# Patient Record
Sex: Female | Born: 1990 | Race: Black or African American | Hispanic: No | Marital: Single
Health system: Southern US, Community
[De-identification: ages and names within clinical notes are randomized; demographics above are authoritative.]

---

## 2017-11-20 DIAGNOSIS — H60332 Swimmer's ear, left ear: Secondary | ICD-10-CM | POA: Diagnosis not present

## 2017-11-20 DIAGNOSIS — H6123 Impacted cerumen, bilateral: Secondary | ICD-10-CM | POA: Diagnosis not present

## 2018-05-31 DIAGNOSIS — B001 Herpesviral vesicular dermatitis: Secondary | ICD-10-CM | POA: Diagnosis not present

## 2018-05-31 DIAGNOSIS — J4520 Mild intermittent asthma, uncomplicated: Secondary | ICD-10-CM | POA: Diagnosis not present

## 2018-05-31 DIAGNOSIS — Z6828 Body mass index (BMI) 28.0-28.9, adult: Secondary | ICD-10-CM | POA: Diagnosis not present

## 2018-07-05 DIAGNOSIS — J101 Influenza due to other identified influenza virus with other respiratory manifestations: Secondary | ICD-10-CM | POA: Diagnosis not present

## 2018-07-05 DIAGNOSIS — R6889 Other general symptoms and signs: Secondary | ICD-10-CM | POA: Diagnosis not present

## 2018-07-05 DIAGNOSIS — Z20828 Contact with and (suspected) exposure to other viral communicable diseases: Secondary | ICD-10-CM | POA: Diagnosis not present

## 2018-09-20 DIAGNOSIS — R300 Dysuria: Secondary | ICD-10-CM | POA: Diagnosis not present

## 2018-09-20 DIAGNOSIS — Z6828 Body mass index (BMI) 28.0-28.9, adult: Secondary | ICD-10-CM | POA: Diagnosis not present

## 2018-09-20 DIAGNOSIS — Z113 Encounter for screening for infections with a predominantly sexual mode of transmission: Secondary | ICD-10-CM | POA: Diagnosis not present

## 2019-04-06 DIAGNOSIS — U071 COVID-19: Secondary | ICD-10-CM | POA: Diagnosis not present

## 2019-04-19 ENCOUNTER — Emergency Department (HOSPITAL_COMMUNITY): Payer: BC Managed Care – PPO

## 2019-04-19 ENCOUNTER — Other Ambulatory Visit: Payer: Self-pay

## 2019-04-19 ENCOUNTER — Inpatient Hospital Stay (HOSPITAL_COMMUNITY)
Admission: EM | Admit: 2019-04-19 | Discharge: 2019-04-21 | DRG: 176 | Disposition: A | Discharge status: Home / Self Care | Payer: BC Managed Care – PPO | Attending: Internal Medicine | Admitting: Internal Medicine

## 2019-04-19 ENCOUNTER — Encounter (HOSPITAL_COMMUNITY): Payer: Self-pay

## 2019-04-19 DIAGNOSIS — N920 Excessive and frequent menstruation with regular cycle: Secondary | ICD-10-CM | POA: Diagnosis present

## 2019-04-19 DIAGNOSIS — Z832 Family history of diseases of the blood and blood-forming organs and certain disorders involving the immune mechanism: Secondary | ICD-10-CM | POA: Diagnosis not present

## 2019-04-19 DIAGNOSIS — R0602 Shortness of breath: Secondary | ICD-10-CM | POA: Diagnosis not present

## 2019-04-19 DIAGNOSIS — R079 Chest pain, unspecified: Secondary | ICD-10-CM | POA: Diagnosis not present

## 2019-04-19 DIAGNOSIS — I4510 Unspecified right bundle-branch block: Secondary | ICD-10-CM | POA: Diagnosis not present

## 2019-04-19 DIAGNOSIS — U071 COVID-19: Secondary | ICD-10-CM | POA: Diagnosis present

## 2019-04-19 DIAGNOSIS — I2699 Other pulmonary embolism without acute cor pulmonale: Principal | ICD-10-CM | POA: Diagnosis present

## 2019-04-19 DIAGNOSIS — Z8619 Personal history of other infectious and parasitic diseases: Secondary | ICD-10-CM | POA: Diagnosis not present

## 2019-04-19 DIAGNOSIS — Z7901 Long term (current) use of anticoagulants: Secondary | ICD-10-CM | POA: Diagnosis not present

## 2019-04-19 DIAGNOSIS — Z91018 Allergy to other foods: Secondary | ICD-10-CM | POA: Diagnosis not present

## 2019-04-19 DIAGNOSIS — I5189 Other ill-defined heart diseases: Secondary | ICD-10-CM | POA: Diagnosis not present

## 2019-04-19 DIAGNOSIS — Z8249 Family history of ischemic heart disease and other diseases of the circulatory system: Secondary | ICD-10-CM | POA: Diagnosis not present

## 2019-04-19 LAB — BASIC METABOLIC PANEL
Anion gap: 10 (ref 5–15)
BUN: 9 mg/dL (ref 6–20)
CO2: 23 mmol/L (ref 22–32)
Calcium: 9.2 mg/dL (ref 8.9–10.3)
Chloride: 105 mmol/L (ref 98–111)
Creatinine, Ser: 0.71 mg/dL (ref 0.44–1.00)
GFR calc Af Amer: >60 mL/min (ref >60)
GFR calc non Af Amer: >60 mL/min (ref >60)
Glucose, Bld: 84 mg/dL (ref 70–99)
Potassium: 3.4 mmol/L — ABNORMAL LOW (ref 3.5–5.1)
Sodium: 138 mmol/L (ref 135–145)

## 2019-04-19 LAB — CBC
HCT: 37.4 % (ref 36.0–46.0)
Hemoglobin: 12.5 g/dL (ref 12.0–15.0)
MCH: 29.3 pg (ref 26.0–34.0)
MCHC: 33.4 g/dL (ref 30.0–36.0)
MCV: 87.6 fL (ref 80.0–100.0)
Platelets: 231 10*3/uL (ref 150–400)
RBC: 4.27 MIL/uL (ref 3.87–5.11)
RDW: 13.1 % (ref 11.5–15.5)
WBC: 7.5 10*3/uL (ref 4.0–10.5)
nRBC: 0 % (ref 0.0–0.2)

## 2019-04-19 LAB — I-STAT BETA HCG BLOOD, ED (MC, WL, AP ONLY): I-stat hCG, quantitative: <5 m[IU]/mL (ref <5)

## 2019-04-19 LAB — TROPONIN I (HIGH SENSITIVITY)
Troponin I (High Sensitivity): <2 ng/L (ref <18)
Troponin I (High Sensitivity): <2 ng/L (ref <18)

## 2019-04-19 MED ORDER — SODIUM CHLORIDE 0.9% FLUSH: 3.0000 mL | Freq: Once | INTRAVENOUS | Status: DC

## 2019-04-19 NOTE — ED Triage Notes (Signed)
Pt tested positive for COVID on 11/7 and states she has been having worsening SOB and chest pain, worse at night. Also reports a knot in the center of her chest. Pt a.o, resp e.u

## 2019-04-20 ENCOUNTER — Encounter (HOSPITAL_COMMUNITY): Payer: Self-pay | Admitting: Internal Medicine

## 2019-04-20 ENCOUNTER — Emergency Department (HOSPITAL_COMMUNITY): Payer: BC Managed Care – PPO

## 2019-04-20 DIAGNOSIS — Z8249 Family history of ischemic heart disease and other diseases of the circulatory system: Secondary | ICD-10-CM

## 2019-04-20 DIAGNOSIS — I5189 Other ill-defined heart diseases: Secondary | ICD-10-CM

## 2019-04-20 DIAGNOSIS — Z832 Family history of diseases of the blood and blood-forming organs and certain disorders involving the immune mechanism: Secondary | ICD-10-CM | POA: Diagnosis not present

## 2019-04-20 DIAGNOSIS — Z8619 Personal history of other infectious and parasitic diseases: Secondary | ICD-10-CM | POA: Diagnosis not present

## 2019-04-20 DIAGNOSIS — I4510 Unspecified right bundle-branch block: Secondary | ICD-10-CM | POA: Diagnosis not present

## 2019-04-20 DIAGNOSIS — Z7901 Long term (current) use of anticoagulants: Secondary | ICD-10-CM | POA: Diagnosis not present

## 2019-04-20 DIAGNOSIS — N920 Excessive and frequent menstruation with regular cycle: Secondary | ICD-10-CM | POA: Diagnosis present

## 2019-04-20 DIAGNOSIS — U071 COVID-19: Secondary | ICD-10-CM | POA: Diagnosis not present

## 2019-04-20 DIAGNOSIS — I2699 Other pulmonary embolism without acute cor pulmonale: Secondary | ICD-10-CM | POA: Diagnosis present

## 2019-04-20 LAB — TROPONIN I (HIGH SENSITIVITY): Troponin I (High Sensitivity): <2 ng/L (ref <18)

## 2019-04-20 LAB — SARS CORONAVIRUS 2 (TAT 6-24 HRS): SARS Coronavirus 2: POSITIVE — AB

## 2019-04-20 LAB — D-DIMER, QUANTITATIVE: D-Dimer, Quant: 2.9 ug/mL-FEU — ABNORMAL HIGH (ref 0.00–0.50)

## 2019-04-20 LAB — HEPARIN LEVEL (UNFRACTIONATED): Heparin Unfractionated: 0.89 IU/mL — ABNORMAL HIGH (ref 0.30–0.70)

## 2019-04-20 LAB — APTT: aPTT: 30 seconds (ref 24–36)

## 2019-04-20 LAB — PROTIME-INR
INR: 1.3 — ABNORMAL HIGH (ref 0.8–1.2)
Prothrombin Time: 15.7 seconds — ABNORMAL HIGH (ref 11.4–15.2)

## 2019-04-20 MED ORDER — HEPARIN (PORCINE) 25000 UT/250ML-% IV SOLN
1000.0000 [IU]/h | INTRAVENOUS | Status: DC
  Administered 2019-04-20: 1250 [IU]/h via INTRAVENOUS
  Filled 2019-04-20 (×2): qty 250

## 2019-04-20 MED ORDER — HEPARIN BOLUS VIA INFUSION
4500.0000 [IU] | Freq: Once | INTRAVENOUS | Status: AC
  Administered 2019-04-20: 4500 [IU] via INTRAVENOUS
  Filled 2019-04-20: qty 4500

## 2019-04-20 MED ORDER — ONDANSETRON HCL 4 MG/2ML IJ SOLN
4.0000 mg | Freq: Four times a day (QID) | INTRAMUSCULAR | Status: DC | PRN
  Administered 2019-04-20: 4 mg via INTRAVENOUS
  Filled 2019-04-20: qty 2

## 2019-04-20 MED ORDER — IBUPROFEN 400 MG PO TABS
400.0000 mg | ORAL_TABLET | Freq: Once | ORAL | Status: AC
  Administered 2019-04-20: 400 mg via ORAL
  Filled 2019-04-20: qty 1

## 2019-04-20 MED ORDER — ACETAMINOPHEN 650 MG RE SUPP: 650.0000 mg | Freq: Four times a day (QID) | RECTAL | Status: DC | PRN

## 2019-04-20 MED ORDER — ACETAMINOPHEN 325 MG PO TABS
650.0000 mg | ORAL_TABLET | Freq: Four times a day (QID) | ORAL | Status: DC | PRN
  Administered 2019-04-20 – 2019-04-21 (×2): 650 mg via ORAL
  Filled 2019-04-20 (×2): qty 2

## 2019-04-20 MED ORDER — ONDANSETRON HCL 4 MG PO TABS: 4.0000 mg | ORAL_TABLET | Freq: Four times a day (QID) | ORAL | Status: DC | PRN

## 2019-04-20 MED ORDER — IOHEXOL 350 MG/ML SOLN
100.0000 mL | Freq: Once | INTRAVENOUS | Status: AC | PRN
  Administered 2019-04-20: 100 mL via INTRAVENOUS

## 2019-04-20 NOTE — ED Provider Notes (Signed)
[facility] EMERGENCY DEPARTMENT Provider Note   CSN: [id number] Arrival date & time: 04/19/19  1956     History   Chief Complaint Chief Complaint  Patient presents with  . Chest Pain  . Shortness of Breath    HPI Chelsea [name] is a 28 y.o. female.     The history is provided by the patient.  Chest Pain Pain location:  L chest Pain quality: sharp   Pain radiates to:  Does not radiate Pain severity:  Moderate Timing:  Intermittent Progression:  Unchanged Chronicity:  New Relieved by:  Nothing Exacerbated by: "worse at night" Associated symptoms: cough, dizziness and shortness of breath   Associated symptoms: no fever, no lower extremity edema and no vomiting   Shortness of Breath Associated symptoms: chest pain and cough   Associated symptoms: no fever and no vomiting    Patient presents with chest pain. She reports that the past 2 nights she has had chest pain.  She reports short of breath.  No fevers or vomiting.  She does report cough.  No dyspnea on exertion.  No pleuritic pain She is not on oral contraceptives.  She is a non-smoker.. She denies any known chronic medical conditions. No hemoptysis Patient reports she was diagnosed with COVID-19 on November 7 of this month.  She reports she had cough and myalgias at that time which prompted testing The chest pain/shortness of breath are new for patient She also reports intermittent dizziness  PMH-none Soc hx nonsmoker OB History   No obstetric history on file.      Home Medications    Prior to Admission medications   Not on File    Family History No family history on file.  Social History Social History   Tobacco Use  . Smoking status: Not on file  Substance Use Topics  . Alcohol use: Not on file  . Drug use: Not on file     Allergies   Patient has no allergy information on record.   Review of Systems Review of Systems  Constitutional: Negative for fever.   Respiratory: Positive for cough and shortness of breath.   Cardiovascular: Positive for chest pain. Negative for leg swelling.  Gastrointestinal: Negative for vomiting.  Neurological: Positive for dizziness.  All other systems reviewed and are negative.    Physical Exam Updated Vital Signs BP (!) 100/55   Pulse 76   Temp 98.7 F (37.1 C) (Oral)   Resp 16   Ht 1.626 m (5\' 4" )   Wt 74.8 kg   LMP 03/19/2019   SpO2 96%   BMI 28.32 kg/m   Physical Exam CONSTITUTIONAL: Well developed/well nourished HEAD: Normocephalic/atraumatic EYES: EOMI/PERRL ENMT: Mucous membranes moist NECK: supple no meningeal signs SPINE/BACK:entire spine nontender CV: S1/S2 noted, no murmurs/rubs/gallops noted LUNGS: Lungs are clear to auscultation bilaterally, no apparent distress ABDOMEN: soft, nontender, no rebound or guarding, bowel sounds noted throughout abdomen GU:no cva tenderness NEURO: Pt is awake/alert/appropriate, moves all extremitiesx4.  No facial droop.   EXTREMITIES: pulses normal/equal, full ROM, no lower extremity return SKIN: warm, color normal PSYCH: no abnormalities of mood noted, alert and oriented to situation   ED Treatments / Results  Labs (all labs ordered are listed, but only abnormal results are displayed) Labs Reviewed  BASIC METABOLIC PANEL - Abnormal; Notable for the following components:      Result Value   Potassium 3.4 (*)    All other components within normal limits  D-DIMER, QUANTITATIVE (NOT AT  [facility]) - Abnormal; Notable for the following components:   D-Dimer, Quant 2.90 (*)    All other components within normal limits  CBC  I-STAT BETA HCG BLOOD, ED ([facility], [facility], [facility] ONLY)  TROPONIN I (HIGH SENSITIVITY)  TROPONIN I (HIGH SENSITIVITY)  TROPONIN I (HIGH SENSITIVITY)    EKG EKG Interpretation  Date/Time:  Wednesday April 20 2019 05:24:18 EST Ventricular Rate:  81 PR Interval:    QRS Duration: 115 QT Interval:  375 QTC Calculation: 436 R Axis:   89  Text Interpretation: Sinus rhythm Incomplete right bundle branch block Low voltage, precordial leads No previous ECGs available Confirmed by Zadie Rhine ([id number]) on 04/20/2019 5:43:39 AM   Radiology Dg Chest Portable 1 View  Result Date: 04/19/2019 CLINICAL DATA:  28 year old female tested positive for COVID-19 10 days ago with increasing shortness of breath and chest pain. EXAM: PORTABLE CHEST 1 VIEW COMPARISON:  None. FINDINGS: Portable AP semi upright view at 2139 hours. Somewhat large lung volumes. Mediastinal contours are within normal limits for portable technique. Visualized tracheal air column is within normal limits. Allowing for portable technique the lungs are clear. No pneumothorax or pleural effusion. No osseous abnormality identified. IMPRESSION: Possible pulmonary hyperinflation but no acute cardiopulmonary abnormality. Electronically Signed   By: Odessa Fleming M.D.   On: 04/19/2019 22:15    Procedures Procedures    Medications Ordered in ED Medications  sodium chloride flush (NS) 0.9 % injection 3 mL (has no administration in time range)  ibuprofen (ADVIL) tablet 400 mg (400 mg Oral Given 04/20/19 0524)     Initial Impression / Assessment and Plan / ED Course  I have reviewed the triage vital signs and the nursing notes.  Pertinent labs & imaging results that were available during my care of the patient were reviewed by me and considered in my medical decision making (see chart for details).        5:18 AM Patient presents for evaluation of chest pain.  She was diagnosed with Covid 11 days ago.  Now having chest pain/shortness of breath.  Initial work-up in the ED is unremarkable.  Will obtain D-dimer.  7:12 AM ddimer elevated Signed out to dr Lockie Mola with CT imaging pending   Chelsea [name] was evaluated in Emergency Department on 04/20/2019 for the symptoms described in the history of present illness. She was evaluated in the context of the global COVID-19  pandemic, which necessitated consideration that the patient might be at risk for infection with the SARS-CoV-2 virus that causes COVID-19. Institutional protocols and algorithms that pertain to the evaluation of patients at risk for COVID-19 are in a state of rapid change based on information released by regulatory bodies including the CDC and federal and state organizations. These policies and algorithms were followed during the patient's care in the ED.  Final Clinical Impressions(s) / ED Diagnoses   Final diagnoses:  None    ED Discharge Orders    None       Zadie Rhine, MD 04/20/19 [phone]

## 2019-04-20 NOTE — ED Notes (Signed)
Ordered diet tray 

## 2019-04-20 NOTE — ED Notes (Signed)
Pt is NSR on monitor 

## 2019-04-20 NOTE — ED Notes (Signed)
Lunch ordered 

## 2019-04-20 NOTE — Progress Notes (Signed)
ANTICOAGULATION CONSULT NOTE - Initial Consult  Pharmacy Consult for IV Heparin Indication: pulmonary embolus  Allergies  Allergen Reactions  . Other Itching    Bananas make her mouth itch    Patient Measurements: Height: 5\' 4"  (162.6 cm) Weight: 165 lb (74.8 kg) IBW/kg (Calculated) : 54.7 Heparin Dosing Weight: 70 kg  Vital Signs: BP: 99/87 (11/18 1900) Pulse Rate: 114 (11/18 1900)  Labs: Recent Labs    04/19/19 2048 04/19/19 2226 04/20/19 0041 04/20/19 1215  HGB 12.5  --   --   --   HCT 37.4  --   --   --   PLT 231  --   --   --   APTT  --   --   --  30  LABPROT  --   --   --  15.7*  INR  --   --   --  1.3*  CREATININE 0.71  --   --   --   TROPONINIHS <2 <2 <2  --     Estimated Creatinine Clearance: 102.7 mL/min (by C-G formula based on SCr of 0.71 mg/dL).   Medical History: History reviewed. No pertinent past medical history.   Assessment: 28 year old female who presents to ED with chest pain and recent diagnosis of COVID on November 7th with elevated d-dimer (2.90) and Ct Angio positive for right lower o PE. Patient was not on anticoagulation prior to admission. Patient is on contact - attempted to call into room without answer. Spoke with patient's RN who helped confirm no anticoagulation prior to admission.   CBC is within normal limits.  No bleeding reported.  Renal function is normal.   Goal of Therapy:  Heparin level 0.3-0.7 units/ml Monitor platelets by anticoagulation protocol: Yes   Plan:  - Heparin level is supra-therapeutic at this time  - Will decrease the patients heparin drip to 1100 units/hr  - Will follow heparin levels every 6 hours until therapeutic then daily heparin levels and CBC while on therapy.  - Monitor for s/s of bleeding   Duanne Limerick, PharmD, BCPS Clinical Pharmacist Please refer to [facility] for [place] numbers 04/20/2019,7:51 PM

## 2019-04-20 NOTE — ED Notes (Signed)
Jeneen Rinks, RN witnessed dose change of heparin drip

## 2019-04-20 NOTE — ED Provider Notes (Signed)
Assumed care of patient at 7 AM.  Patient with chest pain, shortness of breath over the last 2 days.  Had a positive Covid test back on November 4 at her college.  No fever.  Lab work overall unremarkable.  Chest x-ray no signs of pneumonia.  Patient on room air.  Radiology called me on the phone to state that patient has pulmonary embolism on the right side of the chest.  Has mild heart strain.  Patient is not tachycardic.  Blood pressure has been stable at 100s over 70s.  Started on heparin IV bolus and infusion for PE.  Will obtain a new coronavirus test.  Will admit for further care.  Hemodynamically stable at this time.  PE possibly as sequelae from Covid however patient sister also has a history of blood clots and PE.  This chart was dictated using voice recognition software.  Despite best efforts to proofread,  errors can occur which can change the documentation meaning.  .Critical Care Performed by: Lennice Sites, DO Authorized by: Lennice Sites, DO   Critical care provider statement:    Critical care time (minutes):  40   Critical care was necessary to treat or prevent imminent or life-threatening deterioration of the following conditions:  Respiratory failure   Critical care was time spent personally by me on the following activities:  Blood draw for specimens, development of treatment plan with patient or surrogate, discussions with primary provider, evaluation of patient's response to treatment, examination of patient, obtaining history from patient or surrogate, ordering and performing treatments and interventions, ordering and review of laboratory studies, ordering and review of radiographic studies, pulse oximetry, re-evaluation of patient's condition and review of old charts   I assumed direction of critical care for this patient from another provider in my specialty: no        Lennice Sites, DO 04/20/19 [phone]

## 2019-04-20 NOTE — ED Notes (Signed)
Pt to CT

## 2019-04-20 NOTE — H&P (Signed)
Date: 04/20/2019               Patient Name:  Chelsea [name] MRN: [id number]  DOB: [birth year] Age / Sex: 28 y.o., female   PCP: Patient, No Pcp Per         Medical Service: Internal Medicine Teaching Service         Attending Physician: Dr. Lucious Groves, DO    First Contact: Dr. Court Joy  Pager: [phone]  Second Contact: Dr. Koleen Distance Pager: [phone]       After Hours (After 5p/  First Contact Pager: [phone]  weekends / holidays): Second Contact Pager: [phone]   Chief Complaint: Chest pain   History of Present Illness: Chelsea [name] is a 28 y.o female recently diagnosed with COVID-19 who presented to the emergency department with three days of pleuritic chest pain. History was obtained via the patient and through chart review.  On November 3 the patient and her brother developed "cold" like symptoms including myalgias, arthralgias, cough, fatigue and decreased appetite. She is a Ship broker at [employer] and subsequently went for [place] testing the returned positive on November 7. Her symptoms had slowly been resolving however approximately three days she noticed pleuritic chest pain and shortness of breath. She felt that it was worse during the [name]. She did not notice any exertional component as she has been fairly inactive since being diagnosed with COVID.  She does not use oral contraceptives. She does not smoke or use illicit substances. She is no history of miscarriages or previous blood clots. Her sister did have DVT/PE approximately four years ago and is on chronic Eliquis therapy for this. Prior to her blood clot she was started on oral contraceptives however they were uncertain if this was the provoking factor so she is now on lifelong anticoagulation. She also had a paternal uncle that had blood clots. She does not believe anybody in the family has ever had a miscarriage. Nobody in the family has been diagnosed with antiphospholipid syndrome or lupus.  She does have heavy  menstrual periods. They're fairly regular. She is never had a Pap smear. She is not sure if she is fibroids. She is not noticed any changes in her urine. She has not noticed any lower extremity swelling/edema.  Meds:  Patient is not on any prescription or OTC medications/herbal supplements.   Allergies: Allergies as of 04/19/2019  . (Not on File)   History reviewed. No pertinent past medical history.  Family History  Problem Relation Age of Onset  . Pulmonary embolism Sister   . Clotting disorder Other    Social History: Patient is currently a Ship broker at [employer]. He lives with her brother and her sister. She is a never smoker, occasional drinker, and has never used illicit substances.  Review of Systems: A complete ROS was negative except as per HPI.   Physical Exam: Blood pressure 103/71, pulse 77, temperature 98.7 F (37.1 C), temperature source Oral, resp. rate 18, height 5\' 4"  (1.626 m), weight 74.8 kg, last menstrual period 03/19/2019, SpO2 97 %.  General: Well nourished female in no acute distress HENT: Normocephalic, atraumatic, moist mucus membranes Pulm: Good air movement with no wheezing or crackles  CV: RRR, fixed splitting of S2, no murmurs, no rubs, no JVD Abdomen: Active bowel sounds, soft, non-distended, no tenderness to palpation  Extremities: Pulses palpable in all extremities, no LE edema  Skin: Warm and dry  Neuro: Alert and oriented x 3  EKG: personally reviewed: my  interpretation is sinus rhythm with borderline right axis deviation. Incomplete right bundle branch block. No prior EKG to compare.  CTA Chest  1. Changes consistent with right lower lobe pulmonary emboli as well as evidence of right heart strain 2. 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 CTA of the chest subsequently illustrated findings consistent with right lower lobe pulmonary emboli as well as evidence of right heart strain. She was started on IV heparin and admitted for further evaluation/management.   Submassive Pulmonary Embolism  - Currently hemodynamically stable without tachycardia or increased oxygen demand. However, she does have findings consistent with right heart strain including RBBB, fixed splitting of her S2, and CT findings.  - PESI score 29, indicating; Class I, Very Low Risk: 0-1.6% 30-[name] mortality in this group. - Family history significant for VTE/PE in her sister (may have been provoked by OCP) and maternal uncle.  - Given family history will check PT, aPTT, Factor V, and Antiphospholipid panel (checking prior to starting heparin) - Given heavy menstruations evaluating for uterine fibroids could also be considered as this can cause May-Thurner syndrome and predispose to VTE/PE - COVID has also been reported to cause a prothrombotic states. Therefore, this may be a provoked VTE/PE and she may only need anticoagulation for 3 months. - Will monitor on telemetry overnight and transition to Eliquis in the AM   Diet: Regular  VTE ppx: Full dose heparin  CODE STATUS: Full Code  Dispo: Admit patient to Observation with expected length of stay less than 2 midnights.  SignedLevora Dredge, MD 04/20/2019, 10:44 AM  Pager: [phone]

## 2019-04-20 NOTE — ED Notes (Signed)
Gave patient a bedside toilet patient is resting with call bell in reach

## 2019-04-20 NOTE — ED Notes (Signed)
Admitting at bedside 

## 2019-04-20 NOTE — ED Notes (Signed)
Attempted IV without success.  Placed IV team order.

## 2019-04-20 NOTE — Progress Notes (Signed)
ANTICOAGULATION CONSULT NOTE - Initial Consult  Pharmacy Consult for IV Heparin Indication: pulmonary embolus  Not on File  Patient Measurements: Height: 5\' 4"  (162.6 cm) Weight: 165 lb (74.8 kg) IBW/kg (Calculated) : 54.7 Heparin Dosing Weight: 70 kg  Vital Signs: Temp: 98.7 F (37.1 C) (11/17 2346) Temp Source: Oral (11/17 2346) BP: 103/71 (11/18 0545) Pulse Rate: 77 (11/18 0826)  Labs: Recent Labs    04/19/19 2048 04/19/19 2226 04/20/19 0041  HGB 12.5  --   --   HCT 37.4  --   --   PLT 231  --   --   CREATININE 0.71  --   --   TROPONINIHS <2 <2 <2    Estimated Creatinine Clearance: 102.7 mL/min (by C-G formula based on SCr of 0.71 mg/dL).   Medical History: History reviewed. No pertinent past medical history.   Assessment: 28 year old female who presents to ED with chest pain and recent diagnosis of COVID on November 7th with elevated d-dimer (2.90) and Ct Angio positive for right lower o PE. Patient was not on anticoagulation prior to admission. Patient is on contact - attempted to call into room without answer. Spoke with patient's RN who helped confirm no anticoagulation prior to admission.   CBC is within normal limits.  No bleeding reported.  Renal function is normal.   Goal of Therapy:  Heparin level 0.3-0.7 units/ml Monitor platelets by anticoagulation protocol: Yes   Plan:  Heparin bolus 4500 units IV x1 Heparin drip at 1250 units/hr. Heparin level in 6 hours. Will follow heparin levels every 6 hours until therapeutic then daily heparin levels and CBC while on therapy.    Sloan Leiter, PharmD, BCPS, BCCCP Clinical Pharmacist Please refer to [facility] for [place] numbers 04/20/2019,9:45 AM

## 2019-04-20 NOTE — ED Notes (Signed)
IV team at bedside 

## 2019-04-21 DIAGNOSIS — Z91018 Allergy to other foods: Secondary | ICD-10-CM

## 2019-04-21 DIAGNOSIS — N920 Excessive and frequent menstruation with regular cycle: Secondary | ICD-10-CM

## 2019-04-21 DIAGNOSIS — I2699 Other pulmonary embolism without acute cor pulmonale: Principal | ICD-10-CM

## 2019-04-21 DIAGNOSIS — Z7901 Long term (current) use of anticoagulants: Secondary | ICD-10-CM

## 2019-04-21 DIAGNOSIS — U071 COVID-19: Secondary | ICD-10-CM

## 2019-04-21 LAB — CBC
HCT: 36.3 % (ref 36.0–46.0)
Hemoglobin: 11.9 g/dL — ABNORMAL LOW (ref 12.0–15.0)
MCH: 29.5 pg (ref 26.0–34.0)
MCHC: 32.8 g/dL (ref 30.0–36.0)
MCV: 89.9 fL (ref 80.0–100.0)
Platelets: 217 10*3/uL (ref 150–400)
RBC: 4.04 MIL/uL (ref 3.87–5.11)
RDW: 13.1 % (ref 11.5–15.5)
WBC: 6.9 10*3/uL (ref 4.0–10.5)
nRBC: 0 % (ref 0.0–0.2)

## 2019-04-21 LAB — HEPARIN LEVEL (UNFRACTIONATED): Heparin Unfractionated: 0.77 IU/mL — ABNORMAL HIGH (ref 0.30–0.70)

## 2019-04-21 MED ORDER — APIXABAN (ELIQUIS) EDUCATION KIT FOR DVT/PE PATIENTS
PACK | Freq: Once | Status: AC
  Administered 2019-04-21: 11:00:00
  Filled 2019-04-21: qty 1

## 2019-04-21 MED ORDER — APIXABAN 5 MG PO TABS
10.0000 mg | ORAL_TABLET | Freq: Two times a day (BID) | ORAL | Status: DC
  Administered 2019-04-21: 10 mg via ORAL
  Filled 2019-04-21 (×3): qty 2

## 2019-04-21 MED ORDER — APIXABAN 5 MG PO TABS: ORAL_TABLET | ORAL | 0 refills | Status: AC

## 2019-04-21 NOTE — ED Notes (Signed)
Tele   Breakfast ordered  

## 2019-04-21 NOTE — Progress Notes (Signed)
[facility] for IV Heparin Indication: pulmonary embolus  Allergies  Allergen Reactions  . Other Itching    Bananas make her mouth itch    Patient Measurements: Height: 5\' 4"  (162.6 cm) Weight: 165 lb (74.8 kg) IBW/kg (Calculated) : 54.7 Heparin Dosing Weight: 70 kg  Vital Signs: BP: 86/77 (11/19 0600) Pulse Rate: 81 (11/19 0600)  Labs: Recent Labs    04/19/19 2048 04/19/19 2226 04/20/19 0041 04/20/19 1215 04/20/19 2037 04/21/19 0559  HGB 12.5  --   --   --   --  11.9*  HCT 37.4  --   --   --   --  36.3  PLT 231  --   --   --   --  217  APTT  --   --   --  30  --   --   LABPROT  --   --   --  15.7*  --   --   INR  --   --   --  1.3*  --   --   HEPARINUNFRC  --   --   --   --  0.89* 0.77*  CREATININE 0.71  --   --   --   --   --   TROPONINIHS <2 <2 <2  --   --   --     Estimated Creatinine Clearance: 102.7 mL/min (by C-G formula based on SCr of 0.71 mg/dL).   Medical History: History reviewed. No pertinent past medical history.   Assessment: 28 year old female who presents to ED with chest pain and recent diagnosis of COVID on November 7th with elevated d-dimer (2.90) and Ct Angio positive for right lower o PE. Patient was not on anticoagulation prior to admission. Patient is on contact - attempted to call into room without answer. Spoke with patient's RN who helped confirm no anticoagulation prior to admission.   CBC is within normal limits.  No bleeding reported.  Renal function is normal.   11/19 AM update:  Heparin level above goal despite rate decrease  Goal of Therapy:  Heparin level 0.3-0.7 units/ml Monitor platelets by anticoagulation protocol: Yes   Plan:  -Dec heparin to 1000 units/hr -Re-check heparin level at [place], PharmD, [place] Pharmacist Phone: [phone]

## 2019-04-21 NOTE — Discharge Instructions (Signed)
Information on my medicine - ELIQUIS (apixaban)  This medication education was reviewed with me or my healthcare representative as part of my discharge preparation.  The pharmacist that spoke with me during my hospital stay was: Marlowe Aschoff, student pharmacist and Berenice Bouton, PharmD  Why was Eliquis prescribed for you? Eliquis was prescribed to treat blood clots that may have been found in the veins of your legs (deep vein thrombosis) or in your lungs (pulmonary embolism) and to reduce the risk of them occurring again.  What do You need to know about Eliquis ? The starting dose is 10 mg (two 5 mg tablets) taken TWICE daily for the FIRST SEVEN (7) DAYS, then on Thursday 04/28/19 the dose is reduced to ONE 5 mg tablet taken TWICE daily.  Eliquis may be taken with or without food.   Try to take the dose about the same time in the morning and in the evening. If you have difficulty swallowing the tablet whole please discuss with your pharmacist how to take the medication safely.  Take Eliquis exactly as prescribed and DO NOT stop taking Eliquis without talking to the doctor who prescribed the medication.  Stopping may increase your risk of developing a new blood clot.  Refill your prescription before you run out.  After discharge, you should have regular check-up appointments with your healthcare provider that is prescribing your Eliquis.    What do you do if you miss a dose? If a dose of ELIQUIS is not taken at the scheduled time, take it as soon as possible on the same day and twice-daily administration should be resumed. The dose should not be doubled to make up for a missed dose.  Important Safety Information A possible side effect of Eliquis is bleeding. You should call your healthcare provider right away if you experience any of the following: ? Bleeding from an injury or your nose that does not stop. ? Unusual colored urine (red or dark brown) or unusual colored stools (red  or black). ? Unusual bruising for unknown reasons. ? A serious fall or if you hit your head (even if there is no bleeding).  Some medicines may interact with Eliquis and might increase your risk of bleeding or clotting while on Eliquis. To help avoid this, consult your healthcare provider or pharmacist prior to using any new prescription or non-prescription medications, including herbals, vitamins, non-steroidal anti-inflammatory drugs (NSAIDs) and supplements.  This website has more information on Eliquis (apixaban): http://www.eliquis.com/eliquis/home

## 2019-04-21 NOTE — ED Notes (Signed)
Heparin rate decreased to 10 ml per pharmacist order  Heparin infusing at 1000u.

## 2019-04-21 NOTE — ED Notes (Signed)
Pt awake and has no pain she felt dizzy when she got up to void

## 2019-04-21 NOTE — ED Notes (Signed)
Heparin stopped and eliquis given.

## 2019-04-21 NOTE — Discharge Summary (Signed)
Name: Chelsea [name] MRN: [id number] DOB: [birth year] 28 y.o. PCP: Ernestene Kiel, MD  Date of Admission: 04/19/2019  8:06 PM Date of Discharge: 04/21/19 Attending Physician: Lucious Groves, DO  Discharge Diagnosis: 1. Pulmonary embolism 2/2 COVID-19   Discharge Medications: Allergies as of 04/21/2019      Reactions   Other Itching   Bananas make her mouth itch      Medication List    TAKE these medications   apixaban 5 MG Tabs tablet Commonly known as: ELIQUIS Take 2 tablets (10 mg total) by mouth 2 (two) times daily for 7 days, THEN 1 tablet (5 mg total) 2 (two) times daily for 23 days. Start taking on: April 21, 2019       Disposition and follow-up:   Chelsea [name] was discharged from [facility] in Good condition.  At the hospital follow up visit please address:  1.  PE - ensure she is taking Eliquis ok. Will likely need 6 months based on evidence of right heart strain  2.  Labs / imaging needed at time of follow-up: none   3.  Pending labs/ test needing follow-up: antiphospholipid and Factor V labs   Follow-up Appointments: Follow-up Information    Prochnau, Chrys Racer, MD. Schedule an appointment as soon as possible for a visit in 1 week(s).   Specialty: Internal Medicine Contact information: [address] [place] [address] [place] Hospital Course by problem list: 1. Pulmonary embolism 2/2 COVID-19: Chelsea [name] is 28 y/o female with recent diagnosis on of COVID-19 on 11/7 (symptom onset 11/3) who presented with 3 days of pleuritic chest pain. In the ED, her d-dimer was significantly elevated and CTA of the chest subsequently demonstrated RLL pulmonary emboli with evidence of right heart strain. She did not have tachycardia, hypotension or increased O2 demand but given evidence of heart strain she was started on a heparin drip and admitted for further monitoring. Patient also endorses family history of VTE/PE  but no personal history. Antiphospholipid antibodies and factor V labs were drawn prior to starting heparin and still pending at discharge. Patient remained hemodynamically stable and asymptomatic overnight. She was subsequently transitioned off of heparin drip to Eliquis and discharged home with plans to follow-up with her PCP next week. Recommendation would be to continue anticoagulation for 6 months based on evidence of mild right heart strain.   2. Recent COVID-19 infection: repeat COVID-19 test was positive. She is no longer symptomatic and has met CDC criteria to end self-isolation. She was told to still wear a mask when returning to classes.   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 No aneurysmal dilatation or atherosclerotic calcifications are seen. The pulmonary artery shows a normal branching pattern. Considerable filling defects are noted within the lower lobe on the right consistent with pulmonary emboli. No other sizable filling defects are seen. Mild right heart strain is noted with the RV/LV ratio of greater than 1.   Discharge Instructions: Discharge Instructions    Call MD for:  difficulty breathing, headache or visual disturbances  Complete by: As directed    Call MD for:  extreme fatigue   Complete by: As directed    Call MD for:  persistant dizziness or light-headedness   Complete by: As directed    Diet - low sodium heart healthy   Complete by: As directed    Discharge instructions   Complete by: As directed    You were treated for a blood clot in your lungs. Please take the Eliquis 10 mg twice daily for a week, followed by 5 mg twice daily.  Be sure to follow-up with your primary  care doctor. Plan to be on Eliquis for 6 months.   As discussed, you have completed the recommended protocol for quarantine.   Take care!   Increase activity slowly   Complete by: As directed       Signed: Delice Bison, DO 04/21/2019, 10:42 AM   Pager: [phone]

## 2019-04-21 NOTE — Progress Notes (Signed)
  Date: 04/21/2019  Patient name: Chelsea [name]  Medical record number: [id number]  Date of birth: 1990/11/29       Subjective: feeling well, denies any chest pain, no SOB. Slept well last night  Objective:  Vital signs in last 24 hours: Vitals:   04/21/19 0500 04/21/19 0600 04/21/19 0700 04/21/19 0800  BP: 103/71 (!) 86/77 103/70 (!) 89/55  Pulse: 74 81 73 79  Resp: '13 12 20 '$ (!) 21  Temp:      TempSrc:      SpO2: 100% 96% 100% 100%  Weight:      Height:      General: resting in bed Cardiac: RRR, no rubs, murmurs or gallops Pulm: clear to auscultation bilaterally, moving normal volumes of air Abd: soft, nontender, nondistended, BS present Ext: warm and well perfused, no pedal edema Neuro: alert and oriented  Assessment/Plan:   Pulmonary embolism (HCC) 2/2 COVID-19 -vitals stable overnight (normal nocturnal dipping of BP, HR and O2 sats excellent) - transition off Heparin to DOAC>> eliquis '10mg'$  BID x7 days then '5mg'$  BID for 3-6 months (I recommended 6 months due to mild right heart strain. - follow up next week with PCP in [place]. -Antiphospholipid and factor 5 labs pending. - For COVID 19, symptom onset was 11/4, has had improvement/resolution of symptoms and was afebrile without use of antipyretics. Discussed per CDC criteria she has met criteria to end self isolation (covid test still returned positive).  I asked that she still wear a mask.  Dispo: discharge home today  Lucious Groves, DO 04/21/2019, 10:07 AM Pager: [phone]

## 2019-04-24 LAB — ANTIPHOSPHOLIPID SYNDROME EVAL, BLD
Anticardiolipin IgA: <9 APL U/mL (ref 0–11)
Anticardiolipin IgG: <9 GPL U/mL (ref 0–14)
Anticardiolipin IgM: 11 MPL U/mL (ref 0–12)
DRVVT: 37.7 s (ref 0.0–47.0)
PTT Lupus Anticoagulant: 31.9 s (ref 0.0–51.9)
Phosphatydalserine, IgA: 1 APS IgA (ref 0–20)
Phosphatydalserine, IgG: 5 GPS IgG (ref 0–11)
Phosphatydalserine, IgM: 16 MPS IgM (ref 0–25)

## 2019-04-25 LAB — FACTOR 5 LEIDEN

## 2019-05-17 ENCOUNTER — Other Ambulatory Visit: Payer: Self-pay | Admitting: Internal Medicine

## 2019-05-23 DIAGNOSIS — J4520 Mild intermittent asthma, uncomplicated: Secondary | ICD-10-CM | POA: Diagnosis not present

## 2019-05-23 DIAGNOSIS — Z6825 Body mass index (BMI) 25.0-25.9, adult: Secondary | ICD-10-CM | POA: Diagnosis not present

## 2019-05-23 DIAGNOSIS — Z8619 Personal history of other infectious and parasitic diseases: Secondary | ICD-10-CM | POA: Diagnosis not present

## 2019-05-23 DIAGNOSIS — Z79899 Other long term (current) drug therapy: Secondary | ICD-10-CM | POA: Diagnosis not present

## 2019-05-23 DIAGNOSIS — Z86711 Personal history of pulmonary embolism: Secondary | ICD-10-CM | POA: Diagnosis not present

## 2020-02-14 DIAGNOSIS — R079 Chest pain, unspecified: Secondary | ICD-10-CM | POA: Diagnosis not present

## 2020-02-14 DIAGNOSIS — B001 Herpesviral vesicular dermatitis: Secondary | ICD-10-CM | POA: Diagnosis not present

## 2021-06-30 IMAGING — CT CT ANGIO CHEST
2 of 6 series · 18 of 46 positions shown · IV contrast (omnipaque)
Comparison: Plain film from the previous day.

CLINICAL DATA: Shortness of breath and chest pain with history of
[id number] positivity, initial encounter

EXAM:
CT ANGIOGRAPHY CHEST WITH CONTRAST
TECHNIQUE: Multidetector CT imaging of the chest was performed using the
standard protocol during bolus administration of intravenous
contrast. Multiplanar CT image reconstructions and MIPs were
obtained to evaluate the vascular anatomy.
CONTRAST:  100mL OMNIPAQUE IOHEXOL 350 MG/ML SOLN

[Series 7: thins · axial · 0.68mm/px · z∈[+1046,+1298]mm · 15 of 277 slices shown]
[im 13/277  lung]
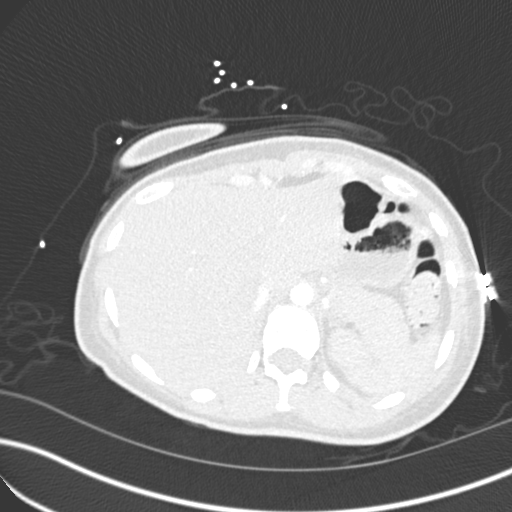
[im 37/277  soft-tissue]
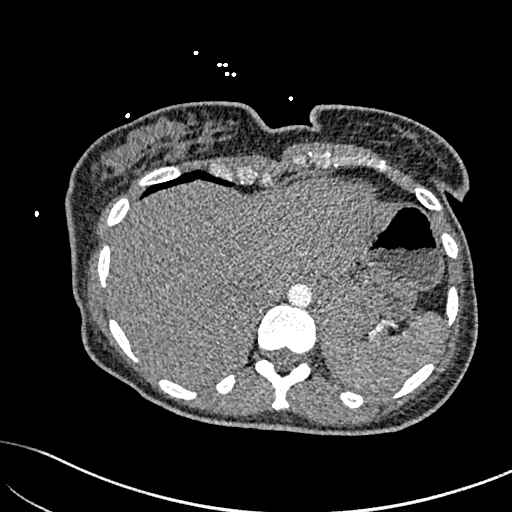
[im 49/277  lung]
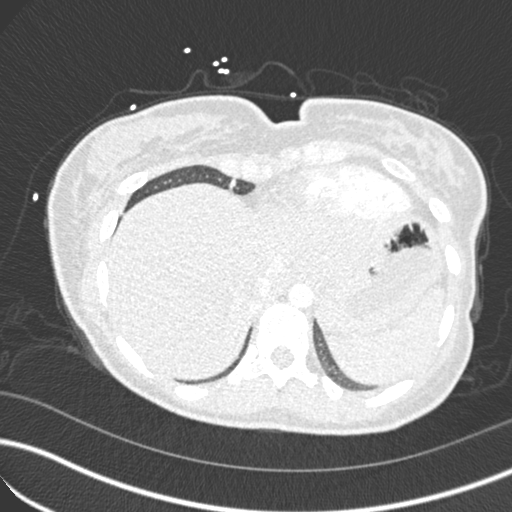
[im 73/277  soft-tissue]
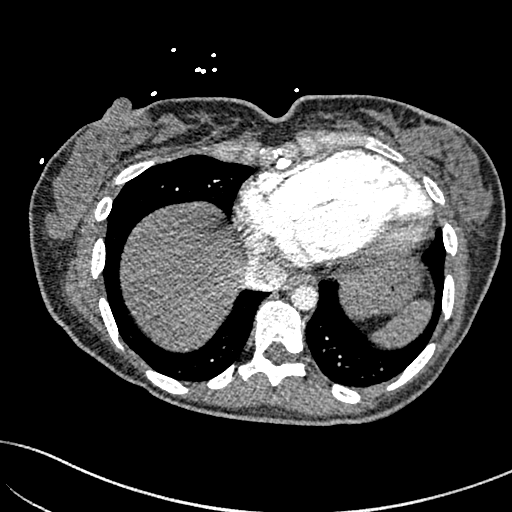
[im 85/277  lung]
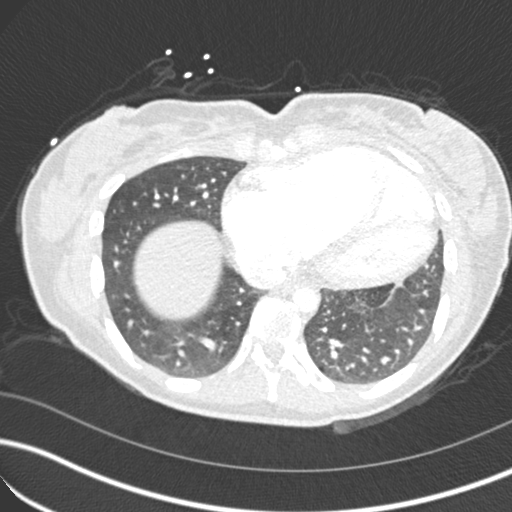
[im 109/277  soft-tissue]
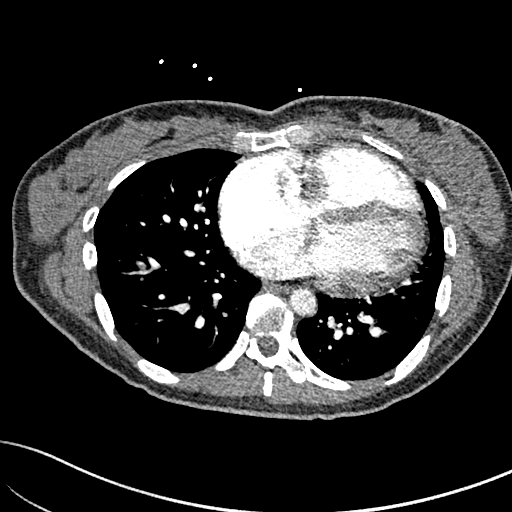
[im 121/277  lung]
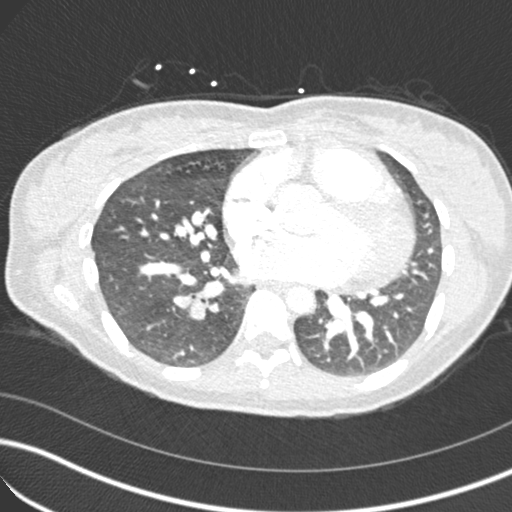
[im 145/277  soft-tissue]
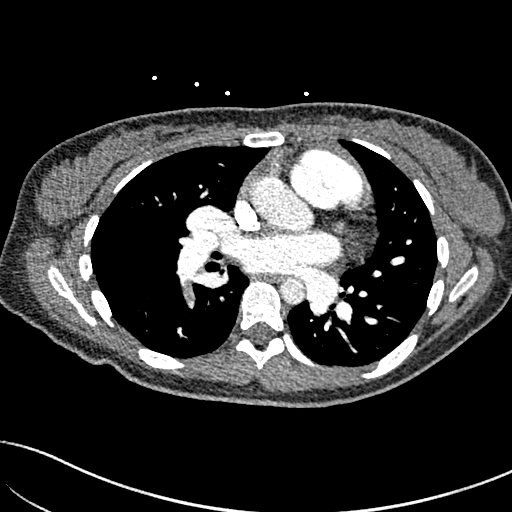
[im 157/277  lung]
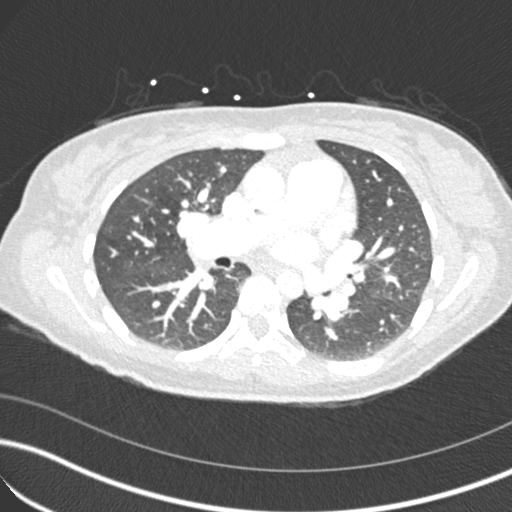
[im 169/277  soft-tissue]
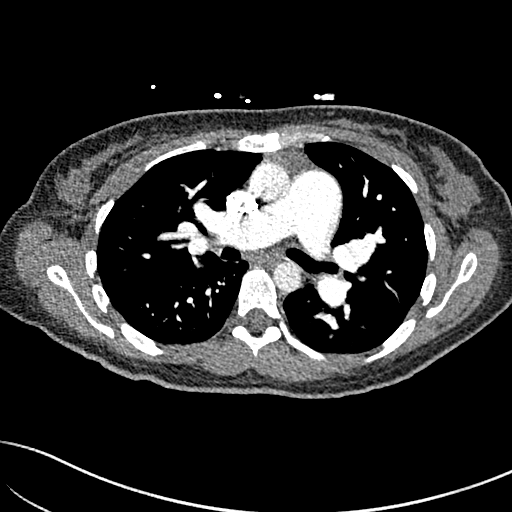
[im 193/277  lung]
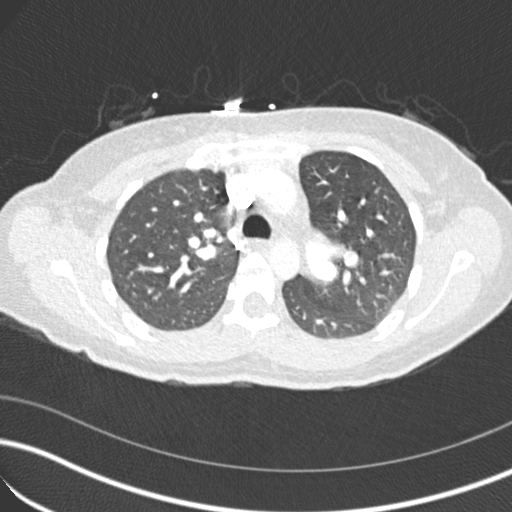
[im 205/277  soft-tissue]
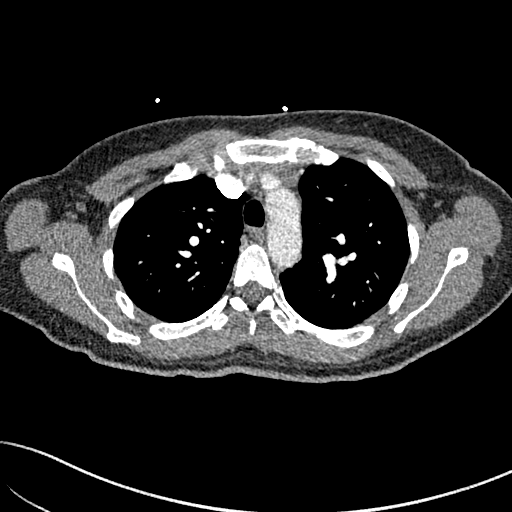
[im 229/277  lung]
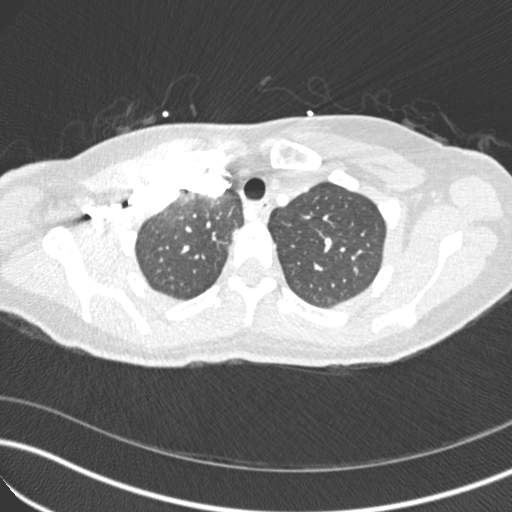
[im 241/277  soft-tissue]
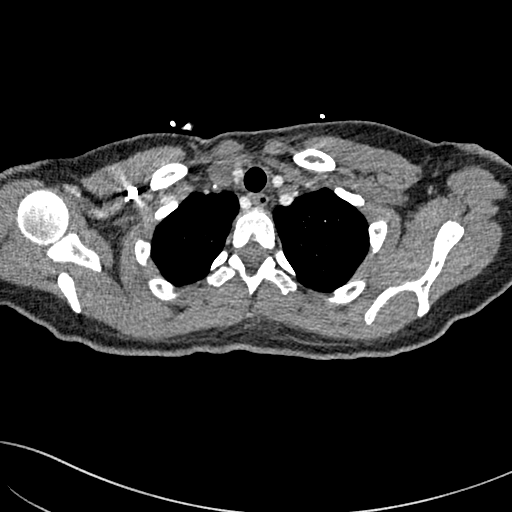
[im 265/277  lung]
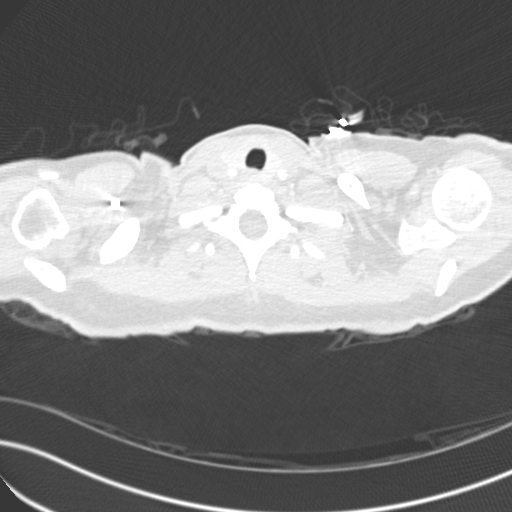

[Series 9: coronal mpr · coronal · 0.59mm/px · 3 of 125 slices shown]
[im 32/125  soft-tissue]
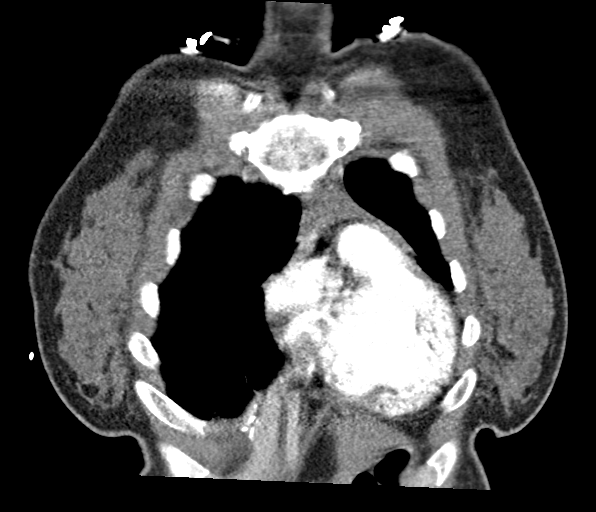
[im 63/125  soft-tissue]
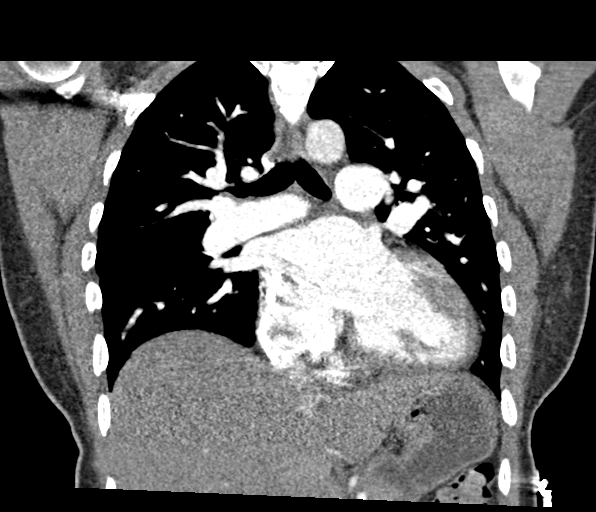
[im 94/125  soft-tissue]
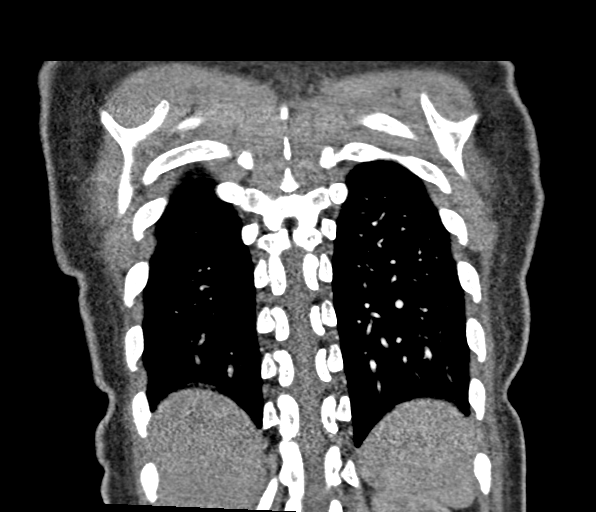

[18 of 46 positions shown; findings below may reference images not displayed]

FINDINGS: Cardiovascular: Thoracic aorta demonstrates variant anatomy with an
aberrant right subclavian artery identified. No aneurysmal
dilatation or atherosclerotic calcifications are seen. The pulmonary
artery shows a normal branching pattern. Considerable filling
defects are noted within the lower lobe on the right consistent with
pulmonary emboli. No other sizable filling defects are seen. Mild
right heart strain is noted with the RV/LV ratio of greater than 1.

Mediastinum/Nodes: No significant hilar or mediastinal adenopathy is
noted. The esophagus is within normal limits. No hilar or
mediastinal adenopathy is noted.

Lungs/Pleura: The lungs are well aerated bilaterally. No focal
infiltrate or sizable effusion is seen. No changes of pulmonary
infarct are noted.

Upper Abdomen: Visualized upper abdomen is unremarkable.

Musculoskeletal: No acute bony abnormality is seen.

Review of the MIP images confirms the above findings.
IMPRESSION: Changes consistent with right lower lobe pulmonary emboli as well as
evidence of right heart strain

Aberrant aortic anatomy with aberrant right subclavian artery origin

Critical Value/emergent results were called by telephone at the time
of interpretation on 04/20/2019 at [DATE] to Dr. Made Agus , who
verbally acknowledged these results.
